# Patient Record
Sex: Female | Born: 1995 | Race: Black or African American | Hispanic: No | Marital: Single | Smoking: Never smoker
Health system: Southern US, Community
[De-identification: ages and names within clinical notes are randomized; demographics above are authoritative.]

---
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
## 2017-01-03 ENCOUNTER — Encounter (HOSPITAL_BASED_OUTPATIENT_CLINIC_OR_DEPARTMENT_OTHER): Payer: Self-pay

## 2017-01-03 ENCOUNTER — Emergency Department (HOSPITAL_BASED_OUTPATIENT_CLINIC_OR_DEPARTMENT_OTHER): Payer: BLUE CROSS/BLUE SHIELD

## 2017-01-03 ENCOUNTER — Emergency Department (HOSPITAL_BASED_OUTPATIENT_CLINIC_OR_DEPARTMENT_OTHER)
Admission: EM | Admit: 2017-01-03 | Discharge: 2017-01-03 | Disposition: A | Discharge status: Home / Self Care | Payer: BLUE CROSS/BLUE SHIELD | Attending: Emergency Medicine | Admitting: Emergency Medicine

## 2017-01-03 DIAGNOSIS — Z79899 Other long term (current) drug therapy: Secondary | ICD-10-CM | POA: Insufficient documentation

## 2017-01-03 DIAGNOSIS — G4089 Other seizures: Secondary | ICD-10-CM | POA: Diagnosis not present

## 2017-01-03 DIAGNOSIS — Z8669 Personal history of other diseases of the nervous system and sense organs: Secondary | ICD-10-CM | POA: Diagnosis not present

## 2017-01-03 DIAGNOSIS — R55 Syncope and collapse: Secondary | ICD-10-CM | POA: Diagnosis not present

## 2017-01-03 DIAGNOSIS — N946 Dysmenorrhea, unspecified: Secondary | ICD-10-CM | POA: Diagnosis not present

## 2017-01-03 LAB — URINALYSIS, ROUTINE W REFLEX MICROSCOPIC
Bilirubin Urine: NEGATIVE
Glucose, UA: NEGATIVE mg/dL
Ketones, ur: NEGATIVE mg/dL
Leukocytes, UA: NEGATIVE
Nitrite: NEGATIVE
Protein, ur: NEGATIVE mg/dL
Specific Gravity, Urine: 1.009 (ref 1.005–1.030)
pH: 8 (ref 5.0–8.0)

## 2017-01-03 LAB — CBC WITH DIFFERENTIAL/PLATELET
BASOS ABS: 0 10*3/uL (ref 0.0–0.1)
Basophils Relative: 0 %
EOS ABS: 0 10*3/uL (ref 0.0–0.7)
EOS PCT: 1 %
HCT: 38.4 % (ref 36.0–46.0)
Hemoglobin: 13.4 g/dL (ref 12.0–15.0)
LYMPHS ABS: 1.2 10*3/uL (ref 0.7–4.0)
LYMPHS PCT: 29 %
MCH: 30.7 pg (ref 26.0–34.0)
MCHC: 34.9 g/dL (ref 30.0–36.0)
MCV: 87.9 fL (ref 78.0–100.0)
MONO ABS: 0.3 10*3/uL (ref 0.1–1.0)
Monocytes Relative: 8 %
Neutro Abs: 2.4 10*3/uL (ref 1.7–7.7)
Neutrophils Relative %: 62 %
PLATELETS: 243 10*3/uL (ref 150–400)
RBC: 4.37 MIL/uL (ref 3.87–5.11)
RDW: 11.4 % — AB (ref 11.5–15.5)
WBC: 3.9 10*3/uL — AB (ref 4.0–10.5)

## 2017-01-03 LAB — COMPREHENSIVE METABOLIC PANEL
ALBUMIN: 4.3 g/dL (ref 3.5–5.0)
ALT: 11 U/L — AB (ref 14–54)
AST: 20 U/L (ref 15–41)
Alkaline Phosphatase: 55 U/L (ref 38–126)
Anion gap: 9 (ref 5–15)
BILIRUBIN TOTAL: 0.8 mg/dL (ref 0.3–1.2)
BUN: 8 mg/dL (ref 6–20)
CO2: 25 mmol/L (ref 22–32)
CREATININE: 0.78 mg/dL (ref 0.44–1.00)
Calcium: 9.1 mg/dL (ref 8.9–10.3)
Chloride: 102 mmol/L (ref 101–111)
GFR calc Af Amer: >60 mL/min (ref >60)
GLUCOSE: 87 mg/dL (ref 65–99)
POTASSIUM: 3.5 mmol/L (ref 3.5–5.1)
Sodium: 136 mmol/L (ref 135–145)
TOTAL PROTEIN: 7.6 g/dL (ref 6.5–8.1)

## 2017-01-03 LAB — URINALYSIS, MICROSCOPIC (REFLEX): WBC, UA: NONE SEEN WBC/hpf (ref 0–5)

## 2017-01-03 LAB — PREGNANCY, URINE: Preg Test, Ur: NEGATIVE

## 2017-01-03 LAB — TROPONIN I

## 2017-01-03 MED ORDER — SODIUM CHLORIDE 0.9 % IV BOLUS (SEPSIS)
1000.0000 mL | Freq: Once | INTRAVENOUS | Status: AC
  Administered 2017-01-03: 1000 mL via INTRAVENOUS

## 2017-01-03 NOTE — ED Triage Notes (Addendum)
Pt reports she "woke up this morning, fell, blacked out and had a seizure by my myself." - States she was home alone. Did not hit head. Denies history of same. States it was approximately 15 seconds.

## 2017-01-03 NOTE — ED Notes (Signed)
Pt unable to void at this time due to have already gone to [facility] without telling staff, so was not given specimen cup. RN made aware.

## 2017-01-03 NOTE — ED Notes (Signed)
Pt on cardiac monitor and auto VS 

## 2017-01-03 NOTE — Discharge Instructions (Signed)
Your workup has been reassuring. Please make sure you follow up with your primary doctor and possible neurology or cardiology referral. Drink plenty of fluids. Return to the ED sooner is symptoms persists.

## 2017-01-06 DIAGNOSIS — R55 Syncope and collapse: Secondary | ICD-10-CM | POA: Diagnosis not present

## 2017-01-06 NOTE — ED Provider Notes (Signed)
[facility]-EMERGENCY DEPT Provider Note   CSN: [id number] Arrival date & time: 01/03/17  1335     History   Chief Complaint Chief Complaint  Patient presents with  . Loss of Consciousness    HPI Lauren Price is a 21 y.o. female.  HPI 21 year old African American female past history significant for dysmenorrhea presents to the ED today with a syncopal episode. Per patient patient was getting out of bed this morning to go turn the thermostat down when she began becoming lightheaded and tunnel vision and had a syncopal episode. The patient states that "I blacked out and had a seizure by myself". When asked what she meant by "seizure". Patient states that she could feel herself shaking. She states that she did not hit her head. The incident was unwitnessed. Patient states that when she came to she returned back in the bed and called her mom to bring her to the ED. The patient denies any urinary incontinence. She denies any oral trauma. Denies any history of seizures or syncopal episodes. States that she is currently on her menstrual cycle but denies any excessive menorrhagia. Does report some dysmenorrhea whichshe has history of same. Patient denies any chest pain, shortness of breath, lightheadedness, dizziness. Denies any lower extremity edema, calf tenderness, recent hospitalizations/service, prolonged immobilizations. Does report OCP use.  Pt denies any fever, chill, ha, vision changes, lightheadedness, dizziness, congestion, neck pain, cp, sob, cough, abd pain, n/v/d, urinary symptoms, change in bowel habits, melena, hematochezia, lower extremity paresthesias.  History reviewed. No pertinent past medical history.  Patient Active Problem List   Diagnosis Date Noted  . Dysmenorrhea 12/24/2011    History reviewed. No pertinent surgical history.  OB History    Gravida Para Term Preterm AB Living   0 0 0 0 0 0   SAB TAB Ectopic Multiple Live Births   0 0 0 0         Home Medications     Prior to Admission medications   Medication Sig Start Date End Date Taking? Authorizing Provider  levonorgestrel-ethinyl estradiol (AVIANE,ALESSE,LESSINA) 0.1-20 MG-MCG tablet Take 1 tablet by mouth daily. 08/09/12 01/03/17 Yes Haygood, Maris BergerVanessa P, MD  Multiple Vitamins-Minerals (MULTIVITAMIN PO) Take 1 tablet by mouth daily.    [provider]    Family History Family History  Problem Relation Age of Onset  . Leukemia Maternal Grandmother   . Leukemia Unknown        Great grandfather  . Hypertension Paternal Grandfather   . Diabetes Paternal Grandfather   . Stroke Maternal Aunt     Social History Social History  Substance Use Topics  . Smoking status: Never Smoker  . Smokeless tobacco: Never Used  . Alcohol use Yes     Comment: occasional     Allergies   Patient has no known allergies.   Review of Systems Review of Systems  Constitutional: Negative for chills and fever.  HENT: Negative for congestion.   Eyes: Negative for visual disturbance.  Respiratory: Negative for cough and shortness of breath.   Cardiovascular: Negative for chest pain.  Gastrointestinal: Negative for abdominal pain, diarrhea, nausea and vomiting.  Genitourinary: Negative for dysuria, flank pain, frequency, hematuria, urgency, vaginal bleeding and vaginal discharge.  Musculoskeletal: Negative for arthralgias and myalgias.  Skin: Negative for rash.  Neurological: Positive for seizures and syncope. Negative for dizziness, weakness, light-headedness, numbness and headaches.  Psychiatric/Behavioral: Negative for sleep disturbance. The patient is not nervous/anxious.      Physical Exam Updated Vital  Signs BP 105/66 (BP Location: Left Arm)   Pulse (!) 58   Temp 99.2 F (37.3 C) (Oral)   Resp 15   Ht 5\' 2"  (1.575 m)   Wt 54.4 kg (120 lb)   LMP 12/31/2016   SpO2 98%   BMI 21.95 kg/m   Physical Exam  Constitutional: She is oriented to person, place, and time. She appears  well-developed and well-nourished.  Non-toxic appearance. No distress.  HENT:  Head: Normocephalic and atraumatic.  Nose: Nose normal.  Mouth/Throat: Oropharynx is clear and moist.  No oral trauma.  Eyes: Pupils are equal, round, and reactive to light. Conjunctivae and EOM are normal. Right eye exhibits no discharge. Left eye exhibits no discharge.  Neck: Normal range of motion. Neck supple.  Cardiovascular: Normal rate, regular rhythm, normal heart sounds and intact distal pulses.  Exam reveals no gallop and no friction rub.   No murmur heard. Pulmonary/Chest: Effort normal and breath sounds normal. No respiratory distress. She has no wheezes. She has no rales. She exhibits no tenderness.  Abdominal: Soft. Bowel sounds are normal. There is no tenderness. There is no rebound and no guarding.  Musculoskeletal: Normal range of motion. She exhibits no tenderness.  Lymphadenopathy:    She has no cervical adenopathy.  Neurological: She is alert and oriented to person, place, and time.  The patient is alert, attentive, and oriented x 3. Speech is clear. Cranial nerve II-VII grossly intact. Negative pronator drift. Sensation intact. Strength 5/5 in all extremities. Reflexes 2+ and symmetric at biceps, triceps, knees, and ankles. Rapid alternating movement and fine finger movements intact. Romberg is absent. Posture and gait normal.   Skin: Skin is warm and dry. Capillary refill takes less than 2 seconds.  Psychiatric: Her behavior is normal. Judgment and thought content normal.  Nursing note and vitals reviewed.    ED Treatments / Results  Labs (all labs ordered are listed, but only abnormal results are displayed) Labs Reviewed  COMPREHENSIVE METABOLIC PANEL - Abnormal; Notable for the following:       Result Value   ALT 11 (*)    All other components within normal limits  CBC WITH DIFFERENTIAL/PLATELET - Abnormal; Notable for the following:    WBC 3.9 (*)    RDW 11.4 (*)    All other  components within normal limits  URINALYSIS, ROUTINE W REFLEX MICROSCOPIC - Abnormal; Notable for the following:    Hgb urine dipstick MODERATE (*)    All other components within normal limits  URINALYSIS, MICROSCOPIC (REFLEX) - Abnormal; Notable for the following:    Bacteria, UA RARE (*)    Squamous Epithelial / LPF 0-5 (*)    All other components within normal limits  TROPONIN I  PREGNANCY, URINE    EKG  EKG Interpretation  Date/Time:  Saturday January 03 2017 13:47:27 EDT Ventricular Rate:  69 PR Interval:    QRS Duration: 84 QT Interval:  363 QTC Calculation: 389 R Axis:   82 Text Interpretation:  Sinus arrhythmia No STEMI No old tracing to compare Confirmed by Drema Pryardama, Pedro [phone]) on 01/03/2017 2:26:19 PM       Radiology No results found.  Procedures Procedures (including critical care time)  Medications Ordered in ED Medications  sodium chloride 0.9 % bolus 1,000 mL (0 mLs Intravenous Stopped 01/03/17 1653)     Initial Impression / Assessment and Plan / ED Course  I have reviewed the triage vital signs and the nursing notes.  Pertinent labs & imaging results  that were available during my care of the patient were reviewed by me and considered in my medical decision making (see chart for details).     Patient presents to the ED with questionable seizure-like activity and possible syncopal episode. The patient states this was unwitnessed. Patient reports passing out however she states she felt herself shaking and was concerned that she was having a seizure. States his last a few seconds and then resolved. She denies a postictal state. Able to get herself up and get her back to the bed and she called her mom to bring her to the ED. Patient has had no further complaints. Patient is well-appearing on exam. She is nontoxic. She has no complaints at this time. EKG shows sinus arrhythmia but no ischemic changes. Troponin is negative. CT of head was obtained due to  questionable seizure-like activity. This was unremarkable. Chest x-ray was unremarkable. Blood work was reassuring. UA with moderate hemoglobin however patient is on her menstrual cycle. No other signs of urinary infection. Patient of focal neuro deficits. Exam is overall reassuring.  Patient given IV fluids. She was watched for several hours with no further episodes. Unsure if this was a true tonic-clonic seizure given patient was able to remember the entire event. Doubt that she had a true syncopal episode given the patient was able to remember the entire event. Unsure of etiology of patient's symptoms. Exam and presentation does not seem concerning for ACS, PE, cardiac arrhythmia. I have encouraged patient to follow up with her PCP, cardiology, and neurology. Have given her a seizure restrictions including driving. Did discuss patient with Dr. Eudelia Bunch who is agreeable with the above plan.   Pt is hemodynamically stable, in NAD, & able to ambulate in the ED. Evaluation does not show pathology that would require ongoing emergent intervention or inpatient treatment. I explained the diagnosis to the patient. Pain has been managed & has no complaints prior to dc. Pt is comfortable with above plan and is stable for discharge at this time. All questions were answered prior to disposition. Strict return precautions for f/u to the ED were discussed. Encouraged follow up with PCP.   Final Clinical Impressions(s) / ED Diagnoses   Final diagnoses:  Syncope and collapse    New Prescriptions Discharge Medication List as of 01/03/2017  4:44 PM       Rise Mu, PA-C 01/06/17 1727    Rise Mu, PA-C 01/06/17 1732    Eudelia Bunch Amadeo Garnet, MD 01/08/17 [phone]

## 2017-01-07 DIAGNOSIS — R55 Syncope and collapse: Secondary | ICD-10-CM | POA: Diagnosis not present

## 2017-01-08 DIAGNOSIS — R55 Syncope and collapse: Secondary | ICD-10-CM | POA: Diagnosis not present

## 2017-01-09 DIAGNOSIS — R55 Syncope and collapse: Secondary | ICD-10-CM | POA: Diagnosis not present

## 2017-03-14 DIAGNOSIS — T22212A Burn of second degree of left forearm, initial encounter: Secondary | ICD-10-CM | POA: Diagnosis not present

## 2017-04-20 DIAGNOSIS — Z124 Encounter for screening for malignant neoplasm of cervix: Secondary | ICD-10-CM | POA: Diagnosis not present

## 2017-04-20 DIAGNOSIS — Z6823 Body mass index (BMI) 23.0-23.9, adult: Secondary | ICD-10-CM | POA: Diagnosis not present

## 2017-04-20 DIAGNOSIS — R42 Dizziness and giddiness: Secondary | ICD-10-CM | POA: Diagnosis not present

## 2017-04-20 DIAGNOSIS — Z01419 Encounter for gynecological examination (general) (routine) without abnormal findings: Secondary | ICD-10-CM | POA: Diagnosis not present

## 2017-04-20 DIAGNOSIS — Z113 Encounter for screening for infections with a predominantly sexual mode of transmission: Secondary | ICD-10-CM | POA: Diagnosis not present

## 2017-04-20 DIAGNOSIS — Z3040 Encounter for surveillance of contraceptives, unspecified: Secondary | ICD-10-CM | POA: Diagnosis not present

## 2017-08-17 DIAGNOSIS — Z113 Encounter for screening for infections with a predominantly sexual mode of transmission: Secondary | ICD-10-CM | POA: Diagnosis not present

## 2017-08-17 DIAGNOSIS — Z01419 Encounter for gynecological examination (general) (routine) without abnormal findings: Secondary | ICD-10-CM | POA: Diagnosis not present

## 2017-08-17 DIAGNOSIS — N930 Postcoital and contact bleeding: Secondary | ICD-10-CM | POA: Diagnosis not present

## 2017-09-03 DIAGNOSIS — L739 Follicular disorder, unspecified: Secondary | ICD-10-CM | POA: Diagnosis not present

## 2017-11-03 DIAGNOSIS — R1030 Lower abdominal pain, unspecified: Secondary | ICD-10-CM | POA: Diagnosis not present

## 2017-11-03 DIAGNOSIS — R350 Frequency of micturition: Secondary | ICD-10-CM | POA: Diagnosis not present

## 2017-11-03 DIAGNOSIS — Z202 Contact with and (suspected) exposure to infections with a predominantly sexual mode of transmission: Secondary | ICD-10-CM | POA: Diagnosis not present

## 2018-02-26 DIAGNOSIS — T07XXXA Unspecified multiple injuries, initial encounter: Secondary | ICD-10-CM | POA: Diagnosis not present

## 2018-02-26 DIAGNOSIS — S39012A Strain of muscle, fascia and tendon of lower back, initial encounter: Secondary | ICD-10-CM | POA: Diagnosis not present

## 2018-02-26 IMAGING — CT CT HEAD W/O CM
3 series · 15 of 47 positions shown, 18 images · non-contrast
Comparison: None.

CLINICAL DATA: Patient with history of seizure.  No head trauma.

EXAM:
CT HEAD WITHOUT CONTRAST
TECHNIQUE: Contiguous axial images were obtained from the base of the skull
through the vertex without intravenous contrast.

[Series 2: head wo · axial · 0.49mm/px · z∈[-158,-18]mm · 9 of 34 slices shown, 12 images]
[im 3/34  brain]
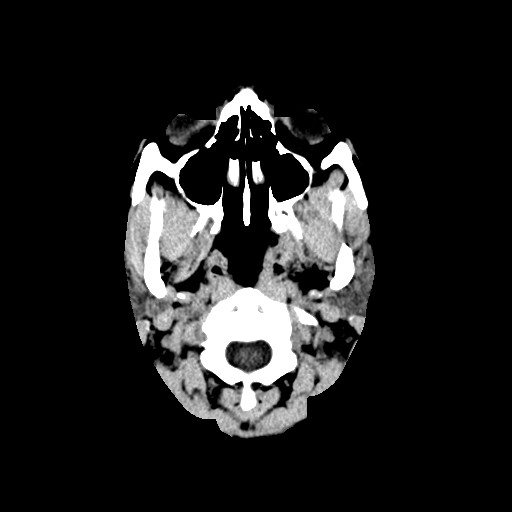
[im 3/34  bone]
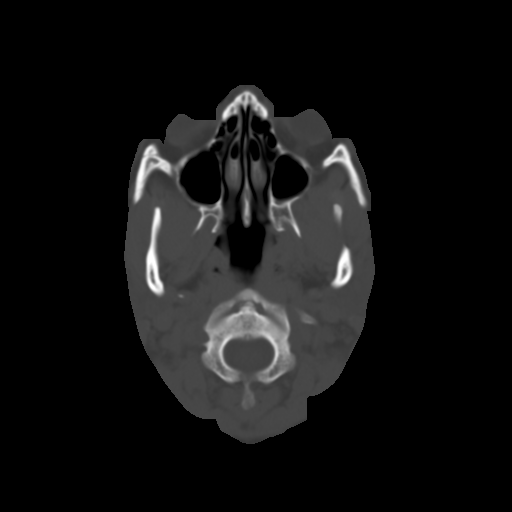
[im 6/34  brain]
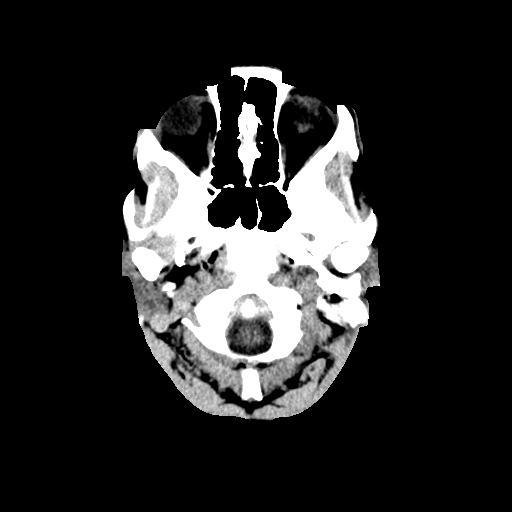
[im 10/34  brain]
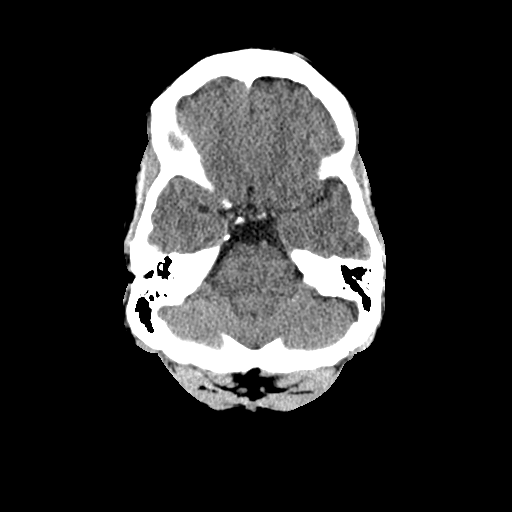
[im 13/34  brain]
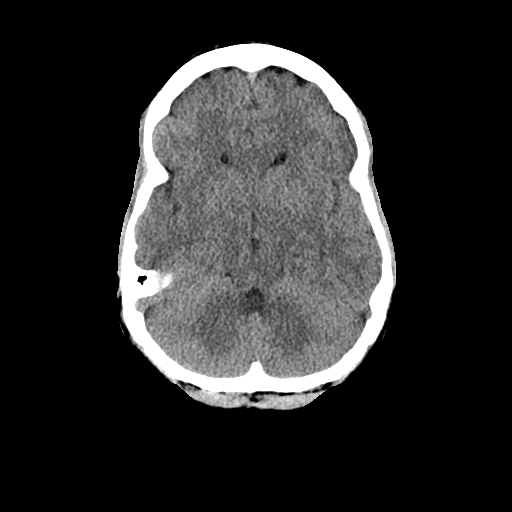
[im 18/34  brain]
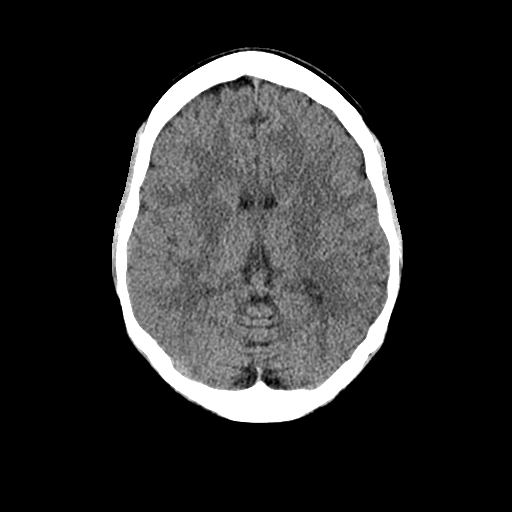
[im 18/34  bone]
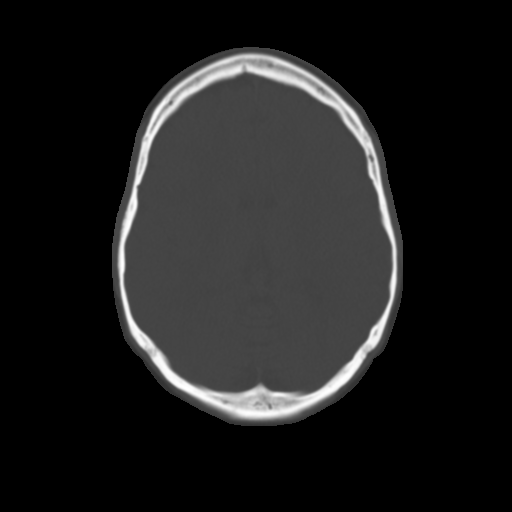
[im 21/34  brain]
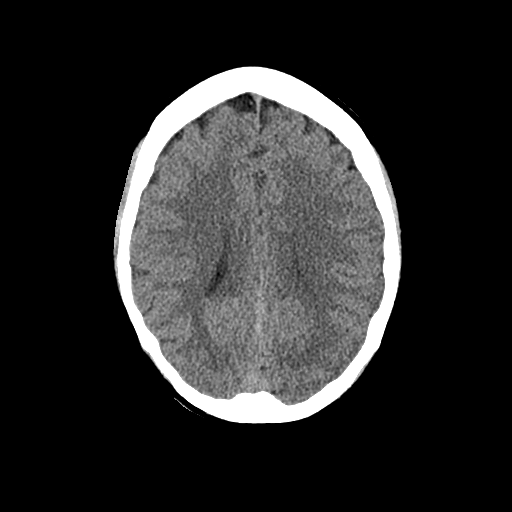
[im 24/34  brain]
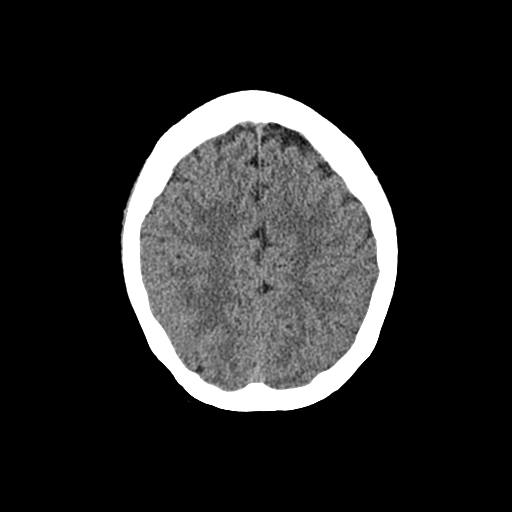
[im 28/34  brain]
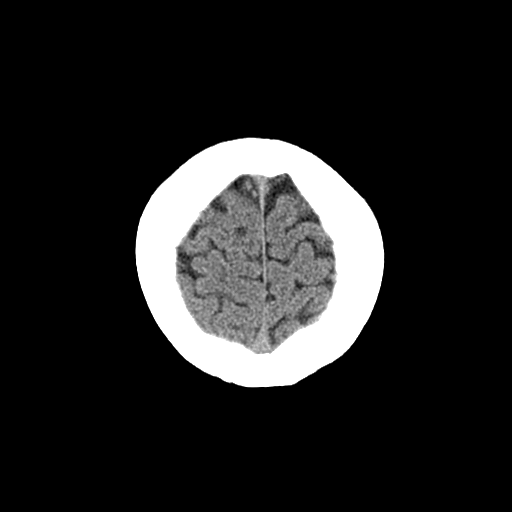
[im 31/34  brain]
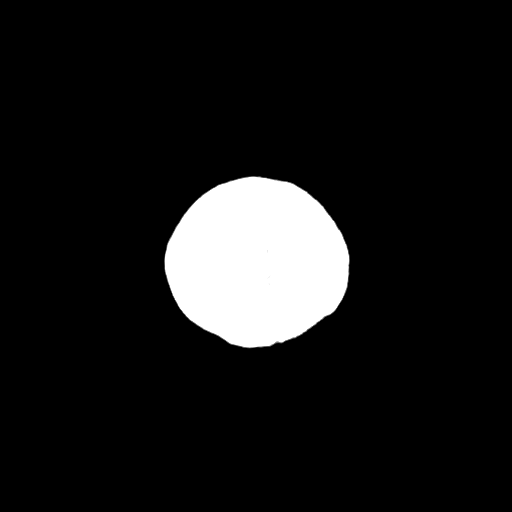
[im 31/34  bone]
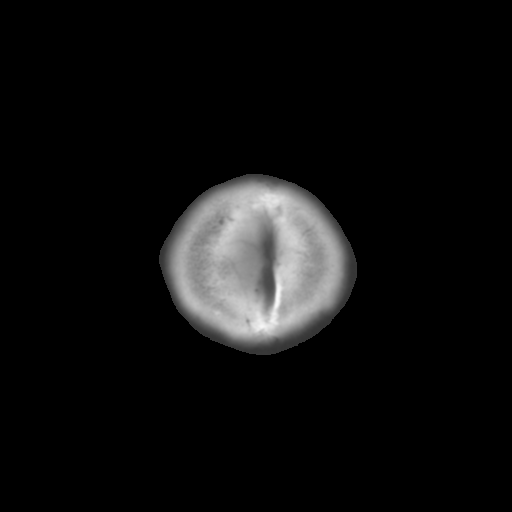

[Series 4: cor soft · coronal · 0.35mm/px · 3 of 69 slices shown]
[im 23/69  brain]
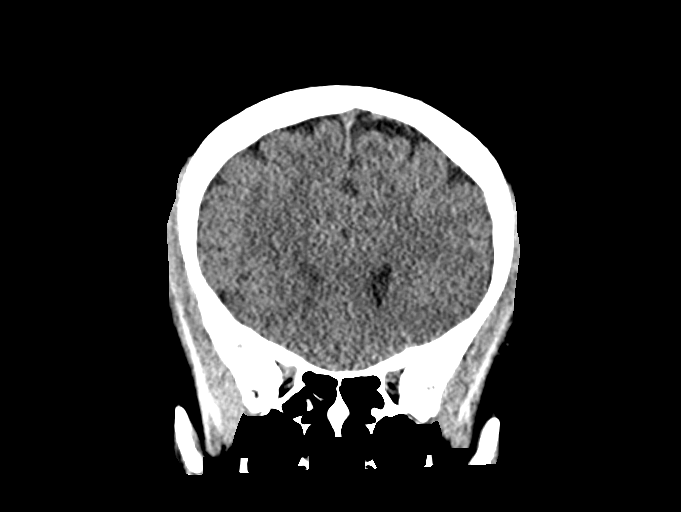
[im 31/69  brain]
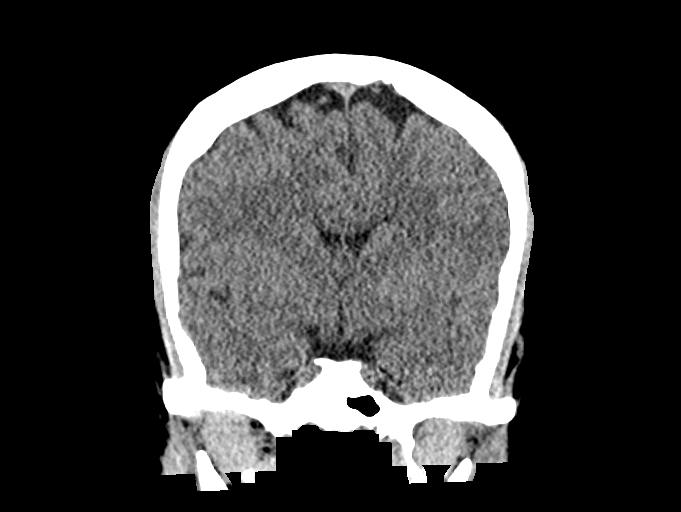
[im 38/69  brain]
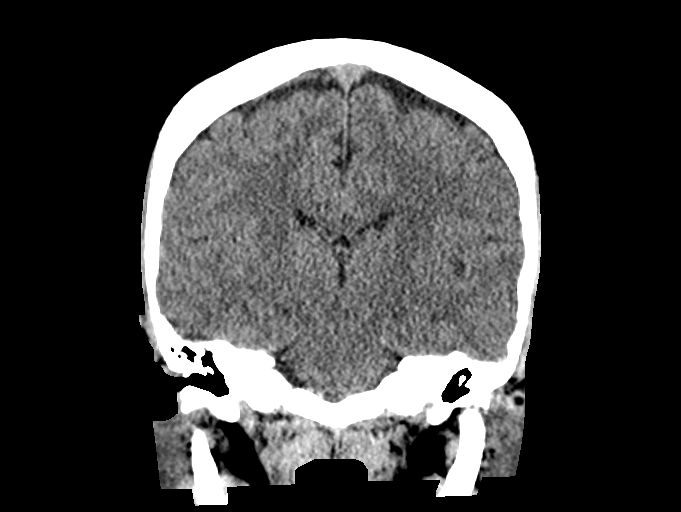

[Series 5: sag soft · sagittal · 0.35mm/px · 3 of 51 slices shown]
[im 17/51  brain]
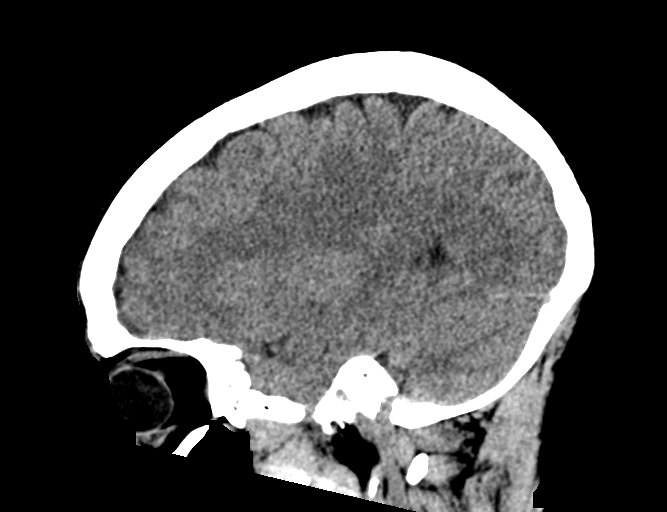
[im 26/51  brain]
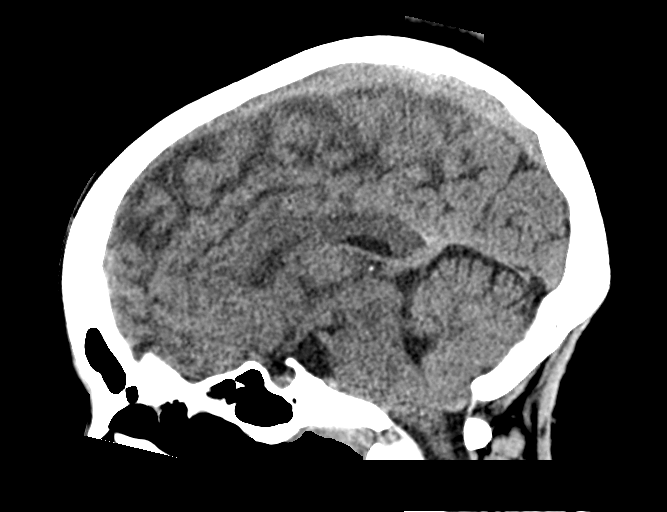
[im 34/51  brain]
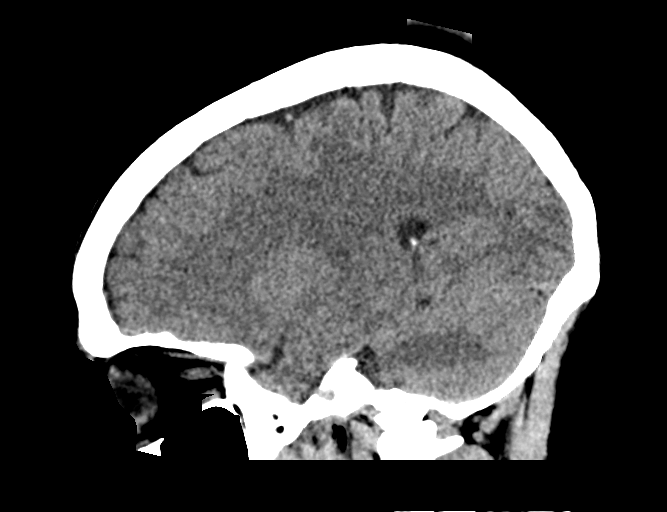

[15 of 47 positions shown; findings below may reference images not displayed]

FINDINGS: Brain: No evidence of acute infarction, hemorrhage, hydrocephalus,
extra-axial collection or mass lesion/mass effect.

Vascular: No hyperdense vessel or unexpected calcification.

Skull: Normal. Negative for fracture or focal lesion.

Sinuses/Orbits: No acute finding.

Other: None.
IMPRESSION: No acute intracranial process.

## 2018-03-01 DIAGNOSIS — R1013 Epigastric pain: Secondary | ICD-10-CM | POA: Diagnosis not present

## 2018-04-08 DIAGNOSIS — M542 Cervicalgia: Secondary | ICD-10-CM | POA: Diagnosis not present

## 2018-04-23 DIAGNOSIS — Z01411 Encounter for gynecological examination (general) (routine) with abnormal findings: Secondary | ICD-10-CM | POA: Diagnosis not present

## 2018-04-23 DIAGNOSIS — Z3040 Encounter for surveillance of contraceptives, unspecified: Secondary | ICD-10-CM | POA: Diagnosis not present

## 2018-04-23 DIAGNOSIS — Z6823 Body mass index (BMI) 23.0-23.9, adult: Secondary | ICD-10-CM | POA: Diagnosis not present

## 2018-04-23 DIAGNOSIS — R42 Dizziness and giddiness: Secondary | ICD-10-CM | POA: Diagnosis not present

## 2018-04-23 DIAGNOSIS — Z113 Encounter for screening for infections with a predominantly sexual mode of transmission: Secondary | ICD-10-CM | POA: Diagnosis not present

## 2018-09-27 DIAGNOSIS — L300 Nummular dermatitis: Secondary | ICD-10-CM | POA: Diagnosis not present

## 2019-04-05 DIAGNOSIS — Z1159 Encounter for screening for other viral diseases: Secondary | ICD-10-CM | POA: Diagnosis not present

## 2019-04-05 DIAGNOSIS — R0789 Other chest pain: Secondary | ICD-10-CM | POA: Diagnosis not present

## 2019-06-01 DIAGNOSIS — L239 Allergic contact dermatitis, unspecified cause: Secondary | ICD-10-CM | POA: Diagnosis not present

## 2019-06-01 DIAGNOSIS — L300 Nummular dermatitis: Secondary | ICD-10-CM | POA: Diagnosis not present

## 2019-06-13 DIAGNOSIS — R509 Fever, unspecified: Secondary | ICD-10-CM | POA: Diagnosis not present

## 2019-06-13 DIAGNOSIS — Z20828 Contact with and (suspected) exposure to other viral communicable diseases: Secondary | ICD-10-CM | POA: Diagnosis not present

## 2019-06-30 DIAGNOSIS — L300 Nummular dermatitis: Secondary | ICD-10-CM | POA: Diagnosis not present

## 2019-06-30 DIAGNOSIS — L239 Allergic contact dermatitis, unspecified cause: Secondary | ICD-10-CM | POA: Diagnosis not present

## 2019-07-07 DIAGNOSIS — Z113 Encounter for screening for infections with a predominantly sexual mode of transmission: Secondary | ICD-10-CM | POA: Diagnosis not present

## 2019-07-07 DIAGNOSIS — Z01419 Encounter for gynecological examination (general) (routine) without abnormal findings: Secondary | ICD-10-CM | POA: Diagnosis not present

## 2019-07-07 DIAGNOSIS — Z6823 Body mass index (BMI) 23.0-23.9, adult: Secondary | ICD-10-CM | POA: Diagnosis not present

## 2019-07-07 DIAGNOSIS — Z124 Encounter for screening for malignant neoplasm of cervix: Secondary | ICD-10-CM | POA: Diagnosis not present

## 2019-09-10 DIAGNOSIS — Z20822 Contact with and (suspected) exposure to covid-19: Secondary | ICD-10-CM | POA: Diagnosis not present

## 2019-09-16 DIAGNOSIS — Z20822 Contact with and (suspected) exposure to covid-19: Secondary | ICD-10-CM | POA: Diagnosis not present

## 2019-11-08 DIAGNOSIS — L309 Dermatitis, unspecified: Secondary | ICD-10-CM | POA: Diagnosis not present

## 2020-01-06 DIAGNOSIS — J069 Acute upper respiratory infection, unspecified: Secondary | ICD-10-CM | POA: Diagnosis not present
